# Patient Record
Sex: Female | Born: 1995 | Race: White | Hispanic: No | Marital: Married | Smoking: Never smoker
Health system: Southern US, Community
[De-identification: ages and names within clinical notes are randomized; demographics above are authoritative.]

## PROBLEM LIST (undated history)

## (undated) ENCOUNTER — Emergency Department: Discharge status: Absent without leave | Payer: BC Managed Care – PPO

## (undated) DIAGNOSIS — F909 Attention-deficit hyperactivity disorder, unspecified type: Secondary | ICD-10-CM

## (undated) DIAGNOSIS — F32A Depression, unspecified: Secondary | ICD-10-CM

## (undated) DIAGNOSIS — F329 Major depressive disorder, single episode, unspecified: Secondary | ICD-10-CM

## (undated) DIAGNOSIS — F419 Anxiety disorder, unspecified: Secondary | ICD-10-CM

## (undated) HISTORY — DX: Major depressive disorder, single episode, unspecified: F32.9

## (undated) HISTORY — DX: Depression, unspecified: F32.A

## (undated) HISTORY — DX: Anxiety disorder, unspecified: F41.9

## (undated) HISTORY — DX: Attention-deficit hyperactivity disorder, unspecified type: F90.9

---
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
## 2017-06-19 ENCOUNTER — Ambulatory Visit (INDEPENDENT_AMBULATORY_CARE_PROVIDER_SITE_OTHER): Payer: BC Managed Care – PPO | Admitting: Obstetrics and Gynecology

## 2017-06-19 ENCOUNTER — Encounter: Payer: Self-pay | Admitting: Obstetrics and Gynecology

## 2017-06-19 VITALS — BP 110/70 | HR 80 | Ht 65.5 in | Wt 142.0 lb

## 2017-06-19 DIAGNOSIS — N760 Acute vaginitis: Secondary | ICD-10-CM | POA: Diagnosis not present

## 2017-06-19 DIAGNOSIS — N898 Other specified noninflammatory disorders of vagina: Secondary | ICD-10-CM | POA: Diagnosis not present

## 2017-06-19 LAB — POCT WET PREP WITH KOH
Clue Cells Wet Prep HPF POC: NEGATIVE
KOH PREP POC: NEGATIVE
Trichomonas, UA: NEGATIVE
Yeast Wet Prep HPF POC: NEGATIVE

## 2017-06-19 MED ORDER — METRONIDAZOLE 500 MG PO TABS: ORAL_TABLET | ORAL | 0 refills | Status: DC

## 2017-06-19 NOTE — Progress Notes (Signed)
Chief Complaint  Patient presents with  . Vaginitis    x a few months    HPI:      [name] is a 21 y.o. No obstetric history on file. who LMP was Patient's last menstrual period was 05/31/2017., presents today for vaginal irritation, d/c and odor for the past few months. Sx come and go but are frequent weekly. She was on abx a month ago for mono, but no abx prior to sx. No urin sx, LBP, belly pain, fevers. No new soaps/detergents. She has not been sex active in 2 yrs and has had neg STD testing since then.     Past Medical History:  Diagnosis Date  . ADHD   . Anxiety   . Depression     No past surgical history on file.  No family history on file.  Social History   Social History  . Marital status: Single    Spouse name: N/A  . Number of children: N/A  . Years of education: N/A   Occupational History  . Not on file.   Social History Main Topics  . Smoking status: Never Smoker  . Smokeless tobacco: Never Used  . Alcohol use No  . Drug use: No  . Sexual activity: Not Currently   Other Topics Concern  . Not on file   Social History Narrative  . No narrative on file     Current Outpatient Prescriptions:  .  amphetamine-dextroamphetamine (ADDERALL) 20 MG tablet, Take 20 mg by mouth daily., Disp: , Rfl:  .  minocycline (MINOCIN,DYNACIN) 100 MG capsule, Take 100 mg by mouth 2 (two) times daily., Disp: , Rfl:  .  sertraline (ZOLOFT) 25 MG tablet, Take 25 mg by mouth daily., Disp: , Rfl:  .  metroNIDAZOLE (FLAGYL) 500 MG tablet, Take 2 tabs BID for 1 day, Disp: 4 tablet, Rfl: 0   ROS:  Review of Systems  Constitutional: Negative for fever.  Gastrointestinal: Negative for blood in stool, constipation, diarrhea, nausea and vomiting.  Genitourinary: Positive for vaginal discharge. Negative for dyspareunia, dysuria, flank pain, frequency, hematuria, urgency, vaginal bleeding and vaginal pain.  Musculoskeletal: Negative for back pain.  Skin: Negative for  rash.     OBJECTIVE:   Vitals:  BP 110/70 (BP Location: Left Arm, Patient Position: Sitting, Cuff Size: Normal)   Pulse 80   Ht 5' 5.5" (1.664 m)   Wt 142 lb (64.4 kg)   LMP 05/31/2017   BMI 23.27 kg/m   Physical Exam  Constitutional: She is oriented to person, place, and time and well-developed, well-nourished, and in no distress. Vital signs are normal.  Genitourinary: Vagina normal, uterus normal, cervix normal, right adnexa normal, left adnexa normal and vulva normal. Uterus is not enlarged. Cervix exhibits no motion tenderness and no tenderness. Right adnexum displays no mass and no tenderness. Left adnexum displays no mass and no tenderness. Vulva exhibits no erythema, no exudate, no lesion, no rash and no tenderness. Vagina exhibits no lesion.  Neurological: She is oriented to person, place, and time.  Vitals reviewed.   Results: Results for orders placed or performed in visit on 06/19/17 (from the past 24 hour(s))  POCT Wet Prep with KOH     Status: Normal   Collection Time: 06/19/17  5:05 PM  Result Value Ref Range   Trichomonas, UA Negative    Clue Cells Wet Prep HPF POC neg    Epithelial Wet Prep HPF POC  Few, Moderate, Many, Too numerous to  count   Yeast Wet Prep HPF POC neg    Bacteria Wet Prep HPF POC  Few   RBC Wet Prep HPF POC     WBC Wet Prep HPF POC     KOH Prep POC Negative Negative     Assessment/Plan: Acute vaginitis - Neg wet prep/exam, but pos sx hx. Treat empirically for BV. Rx flagyl. Will check one swab culture if sx persist. F/u prn.  - Plan: POCT Wet Prep with KOH, metroNIDAZOLE (FLAGYL) 500 MG tablet  Vaginal odor - Plan: POCT Wet Prep with KOH, metroNIDAZOLE (FLAGYL) 500 MG tablet    Return if symptoms worsen or fail to improve.  Laken Lobato B. Fawne Hughley, PA-C 06/19/2017 5:05 PM

## 2017-09-20 ENCOUNTER — Ambulatory Visit: Payer: BC Managed Care – PPO | Admitting: Obstetrics and Gynecology

## 2017-09-20 ENCOUNTER — Encounter: Payer: Self-pay | Admitting: Obstetrics and Gynecology

## 2017-09-20 VITALS — BP 118/72 | Ht 65.0 in | Wt 140.0 lb

## 2017-09-20 DIAGNOSIS — B373 Candidiasis of vulva and vagina: Secondary | ICD-10-CM

## 2017-09-20 DIAGNOSIS — B3731 Acute candidiasis of vulva and vagina: Secondary | ICD-10-CM

## 2017-09-20 LAB — POCT WET PREP WITH KOH
CLUE CELLS WET PREP PER HPF POC: NEGATIVE
KOH Prep POC: NEGATIVE
TRICHOMONAS UA: NEGATIVE
YEAST WET PREP PER HPF POC: POSITIVE

## 2017-09-20 MED ORDER — CLOTRIMAZOLE-BETAMETHASONE 1-0.05 % EX CREA: 1.0000 "application " | TOPICAL_CREAM | Freq: Two times a day (BID) | CUTANEOUS | 0 refills | Status: DC

## 2017-09-20 MED ORDER — FLUCONAZOLE 150 MG PO TABS: 150.0000 mg | ORAL_TABLET | Freq: Once | ORAL | 0 refills | Status: AC

## 2017-09-20 NOTE — Patient Instructions (Signed)
I value your feedback and entrusting us with your care. If you get a [facility] patient survey, I would appreciate you taking the time to let us know about your experience today. Thank you! 

## 2017-09-20 NOTE — Progress Notes (Signed)
Chief Complaint  Patient presents with  . Vaginitis    HPI:      [name] is a 21 y.o. [id number] who LMP was Patient's last menstrual period was 08/22/2017 (exact date)., presents today for 3 days of vaginal dryness, rash, pain. No increased d/c, odor. Did BV tx 9/18 with flagyl with sx relief. Trying A&D oint/vaseline with some relief. No LBP, belly pain, urin sx.  Pt is sex active, using condoms.  Uses dryer sheets, baby soap vaginally. Wears pantyliners daily.    Past Medical History:  Diagnosis Date  . ADHD   . Anxiety   . Depression     Past Surgical History:  Procedure Laterality Date  . CLAVICLE SURGERY  2012    Family History  Problem Relation Age of Onset  . Cancer Neg Hx   . Diabetes Neg Hx   . Heart failure Neg Hx   . Stroke Neg Hx   . Thyroid disease Neg Hx     Social History   Socioeconomic History  . Marital status: Single    Spouse name: Not on file  . Number of children: Not on file  . Years of education: Not on file  . Highest education level: Not on file  Social Needs  . Financial resource strain: Not on file  . Food insecurity - worry: Not on file  . Food insecurity - inability: Not on file  . Transportation needs - medical: Not on file  . Transportation needs - non-medical: Not on file  Occupational History  . Not on file  Tobacco Use  . Smoking status: Never Smoker  . Smokeless tobacco: Never Used  Substance and Sexual Activity  . Alcohol use: No  . Drug use: No  . Sexual activity: Yes    Birth control/protection: None  Other Topics Concern  . Not on file  Social History Narrative  . Not on file     Current Outpatient Medications:  .  amphetamine-dextroamphetamine (ADDERALL XR) 30 MG 24 hr capsule, , Disp: , Rfl:  .  clindamycin (CLEOCIN T) 1 % lotion, , Disp: , Rfl:  .  minocycline (MINOCIN,DYNACIN) 100 MG capsule, Take 100 mg by mouth 2 (two) times daily., Disp: , Rfl:  .  sertraline (ZOLOFT) 100 MG tablet, , Disp:  , Rfl:  .  clotrimazole-betamethasone (LOTRISONE) cream, Apply 1 application topically 2 (two) times daily. Apply externally BID prn sx up to 2 wks, Disp: 15 g, Rfl: 0 .  fluconazole (DIFLUCAN) 150 MG tablet, Take 1 tablet (150 mg total) by mouth once for 1 dose., Disp: 1 tablet, Rfl: 0   ROS:  Review of Systems  Constitutional: Negative for fever.  Gastrointestinal: Negative for blood in stool, constipation, diarrhea, nausea and vomiting.  Genitourinary: Positive for vaginal pain. Negative for dyspareunia, dysuria, flank pain, frequency, hematuria, urgency, vaginal bleeding and vaginal discharge.  Musculoskeletal: Negative for back pain.  Skin: Negative for rash.     OBJECTIVE:   Vitals:  BP 118/72   Ht 5\' 5"  (1.651 m)   Wt 140 lb (63.5 kg)   LMP 08/22/2017 (Exact Date)   BMI 23.30 kg/m   Physical Exam  Constitutional: She is oriented to person, place, and time and well-developed, well-nourished, and in no distress. Vital signs are normal.  Genitourinary: Uterus normal, cervix normal, right adnexa normal and left adnexa normal. Uterus is not enlarged. Cervix exhibits no motion tenderness and no tenderness. Right adnexum displays no mass and no tenderness.  Left adnexum displays no mass and no tenderness. Vulva exhibits erythema, rash and tenderness. Vulva exhibits no exudate and no lesion. Vagina exhibits no lesion. Thick  white and vaginal discharge found.  Genitourinary Comments: ERYTHEMATOUS RASH BILAT LABIA MAJORA WITH SEVERAL FISSURES LT LABIA MAJORA  Neurological: She is oriented to person, place, and time.  Vitals reviewed.   Results: Results for orders placed or performed in visit on 09/20/17 (from the past 24 hour(s))  POCT Wet Prep with KOH     Status: Abnormal   Collection Time: 09/20/17  3:51 PM  Result Value Ref Range   Trichomonas, UA Negative    Clue Cells Wet Prep HPF POC neg    Epithelial Wet Prep HPF POC  Few, Moderate, Many, Too numerous to count   Yeast  Wet Prep HPF POC pos    Bacteria Wet Prep HPF POC  Few   RBC Wet Prep HPF POC     WBC Wet Prep HPF POC     KOH Prep POC Negative Negative     Assessment/Plan: Candidal vaginitis - Pos wet prep/exam. Rx diflucan and lotrisone crm for ext sx. Line dry underwear/dove sens skin soap/change from damp underwear. F/u prn.  - Plan: POCT Wet Prep with KOH, fluconazole (DIFLUCAN) 150 MG tablet, clotrimazole-betamethasone (LOTRISONE) cream    Meds ordered this encounter  Medications  . fluconazole (DIFLUCAN) 150 MG tablet    Sig: Take 1 tablet (150 mg total) by mouth once for 1 dose.    Dispense:  1 tablet    Refill:  0  . clotrimazole-betamethasone (LOTRISONE) cream    Sig: Apply 1 application topically 2 (two) times daily. Apply externally BID prn sx up to 2 wks    Dispense:  15 g    Refill:  0      Return if symptoms worsen or fail to improve.  Alicia B. Copland, PA-C 09/20/2017 3:52 PM

## 2017-10-16 ENCOUNTER — Telehealth: Payer: Self-pay

## 2017-10-16 ENCOUNTER — Other Ambulatory Visit: Payer: Self-pay | Admitting: Obstetrics and Gynecology

## 2017-10-16 ENCOUNTER — Ambulatory Visit: Payer: BC Managed Care – PPO | Admitting: Obstetrics and Gynecology

## 2017-10-16 MED ORDER — TERCONAZOLE 0.4 % VA CREA: 1.0000 | TOPICAL_CREAM | Freq: Every day | VAGINAL | 0 refills | Status: DC

## 2017-10-16 NOTE — Telephone Encounter (Signed)
Pt states she was seen about a month ago for yeast infection. She is still having dryness and irritation and unsure if yeast infection has returned, feels like it has been recurring for awhile. Pt requests cream she can use to help or if she needs appt. Cb# [phone].

## 2017-10-16 NOTE — Telephone Encounter (Signed)
Rx terazol 7 eRxd to [facility]. Pt to treat inside with applicator and put small amt outside. RTO for further eval if sx persist after tx. RN to notify pt

## 2017-10-16 NOTE — Telephone Encounter (Signed)
Pt aware and appreciative. Canceled 2:00 appt this afternoon.

## 2017-10-16 NOTE — Progress Notes (Signed)
Try Rx terazol -7 for recurrent vaginitis sx. Treated 12/18. RTO if sx still persist.

## 2017-12-19 ENCOUNTER — Telehealth: Payer: Self-pay

## 2017-12-19 NOTE — Telephone Encounter (Signed)
Pt wants to talk to someone about getting the nexplanon.  Would it affect the depression med she takes.  [phone].

## 2017-12-20 NOTE — Telephone Encounter (Signed)
[facility] Only been seen for yeast sx.

## 2017-12-20 NOTE — Telephone Encounter (Signed)
Called and spoke with patient to schedule appt. Pt states she will call back to schedule appt once she knows her work schedule

## 2018-03-08 ENCOUNTER — Telehealth: Payer: Self-pay

## 2018-03-08 ENCOUNTER — Other Ambulatory Visit: Payer: Self-pay | Admitting: Obstetrics and Gynecology

## 2018-03-08 MED ORDER — TERCONAZOLE 0.4 % VA CREA: 1.0000 | TOPICAL_CREAM | Freq: Every day | VAGINAL | 0 refills | Status: DC

## 2018-03-08 NOTE — Progress Notes (Signed)
Rx RF for recurrent yeast vag. RTO prn sx

## 2018-03-08 NOTE — Telephone Encounter (Signed)
On call Triage message rcvd. Pt reports having UI in the past. Has had q6-7 mos. Was told to call back and will send rx to the pharmacy. [facility]. Yellowish d/c, odor, vaginal itching. Started about 1-2 wks but worsening past 5 days. Has not tried OTC. No fever.

## 2018-03-08 NOTE — Telephone Encounter (Signed)
Called pt and left vm. Pt aware of rx and to call prn if sx continue

## 2018-03-08 NOTE — Telephone Encounter (Signed)
Rx terazol crm eRxd. F/u if sx persist. Add probiotics/get out of damp underwear, bathing suits.

## 2018-09-24 ENCOUNTER — Other Ambulatory Visit: Payer: Self-pay | Admitting: Orthopedic Surgery

## 2018-09-24 DIAGNOSIS — M25511 Pain in right shoulder: Secondary | ICD-10-CM

## 2018-09-25 ENCOUNTER — Ambulatory Visit
Admission: RE | Admit: 2018-09-25 | Discharge: 2018-09-25 | Disposition: A | Discharge status: Home / Self Care | Payer: BC Managed Care – PPO | Source: Ambulatory Visit | Attending: Orthopedic Surgery | Admitting: Orthopedic Surgery

## 2018-09-25 DIAGNOSIS — M25511 Pain in right shoulder: Secondary | ICD-10-CM | POA: Diagnosis not present

## 2019-04-29 ENCOUNTER — Other Ambulatory Visit: Payer: Self-pay

## 2019-04-29 ENCOUNTER — Encounter: Payer: Self-pay | Admitting: Advanced Practice Midwife

## 2019-04-29 ENCOUNTER — Other Ambulatory Visit (HOSPITAL_COMMUNITY)
Admission: RE | Admit: 2019-04-29 | Discharge: 2019-04-29 | Disposition: A | Discharge status: Home / Self Care | Payer: BC Managed Care – PPO | Source: Ambulatory Visit | Attending: Advanced Practice Midwife | Admitting: Advanced Practice Midwife

## 2019-04-29 ENCOUNTER — Ambulatory Visit (INDEPENDENT_AMBULATORY_CARE_PROVIDER_SITE_OTHER): Payer: BC Managed Care – PPO | Admitting: Advanced Practice Midwife

## 2019-04-29 VITALS — BP 122/74 | Ht 65.0 in | Wt 160.0 lb

## 2019-04-29 DIAGNOSIS — N898 Other specified noninflammatory disorders of vagina: Secondary | ICD-10-CM | POA: Insufficient documentation

## 2019-04-29 MED ORDER — FLUCONAZOLE 150 MG PO TABS: 150.0000 mg | ORAL_TABLET | Freq: Once | ORAL | 1 refills | Status: AC

## 2019-04-29 MED ORDER — METRONIDAZOLE 500 MG PO TABS: 500.0000 mg | ORAL_TABLET | Freq: Two times a day (BID) | ORAL | 0 refills | Status: AC

## 2019-04-29 NOTE — Progress Notes (Signed)
Patient ID: Brittney CousinKalen P Welch, female   DOB: 12/19/1995, 23 y.o.   MRN: [id number]  Reason for Consult: Vaginitis    Subjective:     HPI:  Brittney Parker is a 23 y.o. female is being seen for symptoms of vaginitis. She noticed an increase in discharge with itching about 1.5 weeks ago. She describes the discharge as thick, white. She also reports an odor that is unusual. She has some irritation at the perineum. She is also requesting an IUD string check. She has a history of BV and yeast. Reviewed comfort measures and recommendations for treatment/prevention.   The patient has no other concerns today.  Past Medical History:  Diagnosis Date  . ADHD   . Anxiety   . Depression    Family History  Problem Relation Age of Onset  . Cancer Neg Hx   . Diabetes Neg Hx   . Heart failure Neg Hx   . Stroke Neg Hx   . Thyroid disease Neg Hx    Past Surgical History:  Procedure Laterality Date  . CLAVICLE SURGERY  2012    Short Social History:  Social History   Tobacco Use  . Smoking status: Never Smoker  . Smokeless tobacco: Never Used  Substance Use Topics  . Alcohol use: No    No Known Allergies  Current Outpatient Medications  Medication Sig Dispense Refill  . amphetamine-dextroamphetamine (ADDERALL XR) 30 MG 24 hr capsule     . PARAGARD INTRAUTERINE COPPER IU by Intrauterine route.    . sertraline (ZOLOFT) 100 MG tablet     . buPROPion (WELLBUTRIN XL) 150 MG 24 hr tablet     . fluconazole (DIFLUCAN) 150 MG tablet Take 1 tablet (150 mg total) by mouth once for 1 dose. Can take additional dose three days later if symptoms persist 1 tablet 1  . metroNIDAZOLE (FLAGYL) 500 MG tablet Take 1 tablet (500 mg total) by mouth 2 (two) times daily for 7 days. 14 tablet 0  . minocycline (MINOCIN,DYNACIN) 100 MG capsule Take 100 mg by mouth 2 (two) times daily.     No current facility-administered medications for this visit.     Review of Systems  Constitutional: Negative.   HENT:  Negative.   Eyes: Negative.   Respiratory: Negative.   Cardiovascular: Negative.   Gastrointestinal: Negative.   Genitourinary:       Vaginal discharge with itching and odor. Small abrasion at perineum.  Musculoskeletal: Negative.   Skin: Negative.   Neurological: Negative.   Endo/Heme/Allergies: Negative.   Psychiatric/Behavioral: Negative.         Objective:  Objective   Vitals:   04/29/19 1134  BP: 122/74  Weight: 160 lb (72.6 kg)  Height: 5\' 5"  (1.651 m)   Body mass index is 26.63 kg/m. Constitutional: Well nourished, well developed female in no acute distress.  HEENT: normal Skin: Warm and dry.  Cardiovascular: Regular rate and rhythm.   Respiratory: Normal respiratory effort Neuro: DTRs 2+, Cranial nerves grossly intact Psych: Alert and Oriented x3. No memory deficits. Normal mood and affect.  MS: normal gait, normal bilateral lower extremity ROM/strength/stability.  Pelvic exam:  is not limited by body habitus EGBUS: within normal limits, very small area of irritation at perineum Vagina: within normal limits and with normal mucosa, normal discharge, no abnormal odor  Cervix: normal appearance, IUD strings visible       Assessment/Plan:     23 yo G0 P0 female with some symptoms of BV/yeast.  Aptima  swab to lab Rx Metronidazole Rx Diflucan Patient agrees to wait until lab results prior to taking medications Symptom relief: sea salt soak, Boric acid suppositories, probiotics, avoid tight fitting clothes, wear cotton underwear and use hypoallergenic body and laundry care products     [facility] 04/29/2019, 1:23 PM

## 2019-04-29 NOTE — Patient Instructions (Signed)
Vaginitis Vaginitis is a condition in which the vaginal tissue swells and becomes red (inflamed). This condition is most often caused by a change in the normal balance of bacteria and yeast that live in the vagina. This change causes an overgrowth of certain bacteria or yeast, which causes the inflammation. There are different types of vaginitis, but the most common types are:  Bacterial vaginosis.  Yeast infection (candidiasis).  Trichomoniasis vaginitis. This is a sexually transmitted disease (STD).  Viral vaginitis.  Atrophic vaginitis.  Allergic vaginitis. What are the causes? The cause of this condition depends on the type of vaginitis. It can be caused by:  Bacteria (bacterial vaginosis).  Yeast, which is a fungus (yeast infection).  A parasite (trichomoniasis vaginitis).  A virus (viral vaginitis).  Low hormone levels (atrophic vaginitis). Low hormone levels can occur during pregnancy, breastfeeding, or after menopause.  Irritants, such as bubble baths, scented tampons, and feminine sprays (allergic vaginitis). Other factors can change the normal balance of the yeast and bacteria that live in the vagina. These include:  Antibiotic medicines.  Poor hygiene.  Diaphragms, vaginal sponges, spermicides, birth control pills, and intrauterine devices (IUD).  Sex.  Infection.  Uncontrolled diabetes.  A weakened defense (immune) system. What increases the risk? This condition is more likely to develop in women who:  Smoke.  Use vaginal douches, scented tampons, or scented sanitary pads.  Wear tight-fitting pants.  Wear thong underwear.  Use oral birth control pills or an IUD.  Have sex without a condom.  Have multiple sex partners.  Have an STD.  Frequently use the spermicide nonoxynol-9.  Eat lots of foods high in sugar.  Have uncontrolled diabetes.  Have low estrogen levels.  Have a weakened immune system from an immune disorder or medical  treatment.  Are pregnant or breastfeeding. What are the signs or symptoms? Symptoms vary depending on the cause of the vaginitis. Common symptoms include:  Abnormal vaginal discharge. ? The discharge is white, gray, or yellow with bacterial vaginosis. ? The discharge is thick, white, and cheesy with a yeast infection. ? The discharge is frothy and yellow or greenish with trichomoniasis.  A bad vaginal smell. The smell is fishy with bacterial vaginosis.  Vaginal itching, pain, or swelling.  Sex that is painful.  Pain or burning when urinating. Sometimes there are no symptoms. How is this diagnosed? This condition is diagnosed based on your symptoms and medical history. A physical exam, including a pelvic exam, will also be done. You may also have other tests, including:  Tests to determine the pH level (acidity or alkalinity) of your vagina.  A whiff test, to assess the odor that results when a sample of your vaginal discharge is mixed with a potassium hydroxide solution.  Tests of vaginal fluid. A sample will be examined under a microscope. How is this treated? Treatment varies depending on the type of vaginitis you have. Your treatment may include:  Antibiotic creams or pills to treat bacterial vaginosis and trichomoniasis.  Antifungal medicines, such as vaginal creams or suppositories, to treat a yeast infection.  Medicine to ease discomfort if you have viral vaginitis. Your sexual partner should also be treated.  Estrogen delivered in a cream, pill, suppository, or vaginal ring to treat atrophic vaginitis. If vaginal dryness occurs, lubricants and moisturizing creams may help. You may need to avoid scented soaps, sprays, or douches.  Stopping use of a product that is causing allergic vaginitis. Then using a vaginal cream to treat the symptoms. Follow   these instructions at home: Lifestyle  Keep your genital area clean and dry. Avoid soap, and only rinse the area with  water.  Do not douche or use tampons until your health care provider says it is okay to do so. Use sanitary pads, if needed.  Do not have sex until your health care provider approves. When you can return to sex, practice safe sex and use condoms.  Wipe from front to back. This avoids the spread of bacteria from the rectum to the vagina. General instructions  Take over-the-counter and prescription medicines only as told by your health care provider.  If you were prescribed an antibiotic medicine, take or use it as told by your health care provider. Do not stop taking or using the antibiotic even if you start to feel better.  Keep all follow-up visits as told by your health care provider. This is important. How is this prevented?  Use mild, non-scented products. Do not use things that can irritate the vagina, such as fabric softeners. Avoid the following products if they are scented: ? Feminine sprays. ? Detergents. ? Tampons. ? Feminine hygiene products. ? Soaps or bubble baths.  Let air reach your genital area. ? Wear cotton underwear to reduce moisture buildup. ? Avoid wearing underwear while you sleep. ? Avoid wearing tight pants and underwear or nylons without a cotton panel. ? Avoid wearing thong underwear.  Take off any wet clothing, such as bathing suits, as soon as possible.  Practice safe sex and use condoms. Contact a health care provider if:  You have abdominal pain.  You have a fever.  You have symptoms that last for more than 2-3 days. Get help right away if:  You have a fever and your symptoms suddenly get worse. Summary  Vaginitis is a condition in which the vaginal tissue becomes inflamed.This condition is most often caused by a change in the normal balance of bacteria and yeast that live in the vagina.  Treatment varies depending on the type of vaginitis you have.  Do not douche, use tampons , or have sex until your health care provider approves. When  you can return to sex, practice safe sex and use condoms. This information is not intended to replace advice given to you by your health care provider. Make sure you discuss any questions you have with your health care provider. Document Released: 07/10/2007 Document Revised: 08/25/2017 Document Reviewed: 10/18/2016 Elsevier Patient Education  2020 [employer]vier Inc.  

## 2019-05-03 LAB — CERVICOVAGINAL ANCILLARY ONLY
Bacterial vaginitis: POSITIVE — AB
Candida vaginitis: NEGATIVE

## 2020-08-17 ENCOUNTER — Ambulatory Visit: Payer: BC Managed Care – PPO | Admitting: Obstetrics

## 2020-09-21 ENCOUNTER — Ambulatory Visit (INDEPENDENT_AMBULATORY_CARE_PROVIDER_SITE_OTHER): Payer: BC Managed Care – PPO | Admitting: Obstetrics

## 2020-09-21 ENCOUNTER — Other Ambulatory Visit: Payer: Self-pay

## 2020-09-21 ENCOUNTER — Encounter: Payer: Self-pay | Admitting: Obstetrics

## 2020-09-21 DIAGNOSIS — Z30432 Encounter for removal of intrauterine contraceptive device: Secondary | ICD-10-CM

## 2020-09-21 NOTE — Progress Notes (Signed)
°  History of Present Illness:  Brittney Parker is a 24 y.o. that had a Paragard IUD placed approximately 3 years ago. Since that time, she states that she has been happy with the method. She is requesting removal of the IUD as she and her husband would like to get pregnant. Hr husband is present and supportive with her today.  She has not been able to feeli the IUD strings, but has been told at her annual physical appointments that the strings are visible.   Patient Active Problem List   Diagnosis Date Noted   Vaginitis 06/19/2017   Medications:  Current Outpatient Medications on File Prior to Visit  Medication Sig Dispense Refill   lactobacillus acidophilus (BACID) TABS tablet Take 1 tablet by mouth daily.     Prenatal Vit-Fe Fumarate-FA (MULTIVITAMIN-PRENATAL) 27-0.8 MG TABS tablet Take 1 tablet by mouth daily at 12 noon.     sertraline (ZOLOFT) 100 MG tablet      amphetamine-dextroamphetamine (ADDERALL XR) 30 MG 24 hr capsule  (Patient not taking: Reported on 09/21/2020)     buPROPion (WELLBUTRIN XL) 150 MG 24 hr tablet  (Patient not taking: Reported on 09/21/2020)     minocycline (MINOCIN,DYNACIN) 100 MG capsule Take 100 mg by mouth 2 (two) times daily. (Patient not taking: Reported on 09/21/2020)     No current facility-administered medications on file prior to visit.   Allergies: has No Known Allergies.  Physical Exam:  BP 100/60    Pulse (!) 54    Ht 5\' 5"  (1.651 m)    Wt 152 lb (68.9 kg)    LMP 09/14/2020 (Exact Date)    BMI 25.29 kg/m  Body mass index is 25.29 kg/m. Constitutional: Well nourished, well developed female in no acute distress.  Abdomen: diffusely non tender to palpation, non distended, and no masses, hernias Neuro: Grossly intact Psych:  Normal mood and affect.    Pelvic exam:  Two IUD strings present seen coming from the cervical os. EGBUS, vaginal vault and cervix: within normal limits  IUD Removal Strings of IUD identified and grasped.  IUD removed  without problem.  Pt tolerated this well.  IUD noted to be intact.  Assessment: IUD Removal  Plan: IUD removed and plan for contraception is no method. She was amenable to this plan. We discussed her starting on a daily prenatal vitamin. Additional time was spent discussing her mood history, and whether she might continue on daily Zoloft (100 mg) or need to discontinue this. She is well maintained on the Sertraline, and feels that she would struggle with anxiety/depression should she stop taking.  Discussed the unknown, but likely low risk to a baby should she remain on the medication. Encouraged her to discuss this with her PCP and OB/GYN. Option to decrease her daily dosage, slowly wean off the medication, or stay at her current dosage all discussed.  09/16/2020 09/21/2020 12:40 PM
